# Patient Record
Sex: Male | Born: 1980 | Race: Black or African American | Hispanic: No | Marital: Single | Smoking: Current every day smoker
Health system: Southern US, Community
[De-identification: ages and names within clinical notes are randomized; demographics above are authoritative.]

## PROBLEM LIST (undated history)

## (undated) HISTORY — PX: HERNIA REPAIR: SHX51

## (undated) HISTORY — PX: KNEE ARTHROSCOPY: SUR90

## (undated) HISTORY — PX: HAND ARTHROPLASTY: SHX968

---

## 2005-03-31 ENCOUNTER — Ambulatory Visit: Payer: Self-pay | Admitting: Internal Medicine

## 2005-03-31 ENCOUNTER — Inpatient Hospital Stay (HOSPITAL_COMMUNITY): Admission: EM | Admit: 2005-03-31 | Discharge: 2005-04-06 | Payer: Self-pay | Admitting: Emergency Medicine

## 2005-04-03 ENCOUNTER — Ambulatory Visit: Payer: Self-pay | Admitting: Internal Medicine

## 2005-04-19 ENCOUNTER — Ambulatory Visit: Payer: Self-pay | Admitting: Family Medicine

## 2005-04-24 ENCOUNTER — Ambulatory Visit: Payer: Self-pay | Admitting: Family Medicine

## 2007-04-11 IMAGING — CT CT CHEST W/ CM
2 of 6 series · 15 of 36 positions shown, 18 images · IV contrast (omnipaque)
Comparison: none

CLINICAL DATA: Hemoptysis, cough.    Right hilar mass and possible right lung cavitation seen on recent chest radiograph.
CHEST CT WITH CONTRAST:
TECHNIQUE: Multidetector CT imaging of the chest was performed following the standard protocol during bolus administration of intravenous contrast.
Contrast:  80 cc Omnipaque 300.

[Series 5: thin chest 2.0 b40f st · axial · 0.71mm/px · z∈[+1083,+1421]mm · 12 of 533 slices shown, 15 images]
[im 25/533  mediastinal]
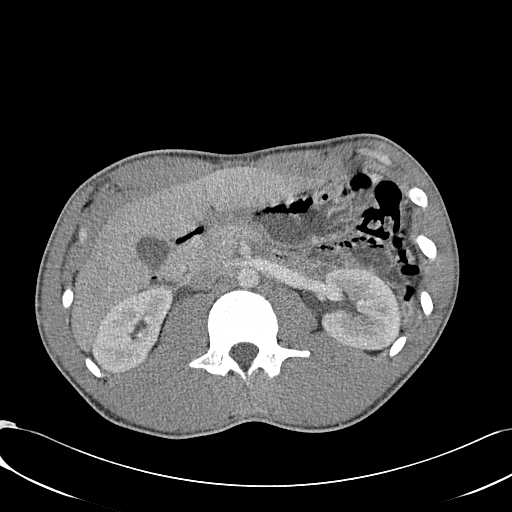
[im 25/533  lung]
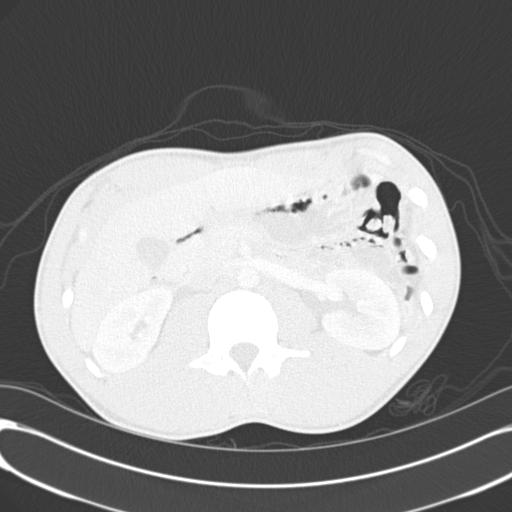
[im 73/533  lung]
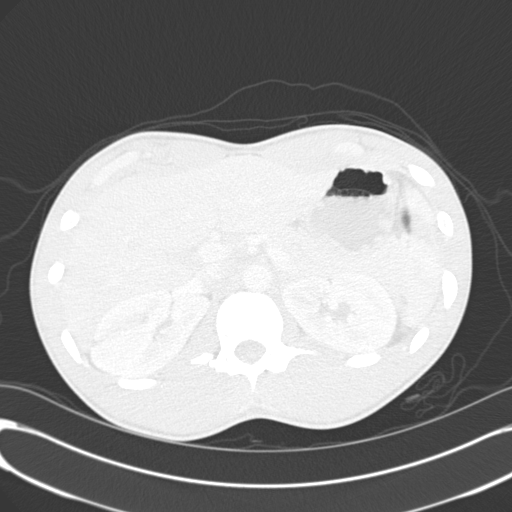
[im 121/533  lung]
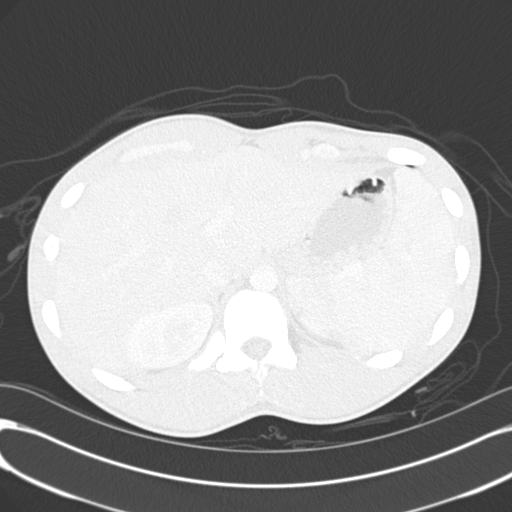
[im 170/533  lung]
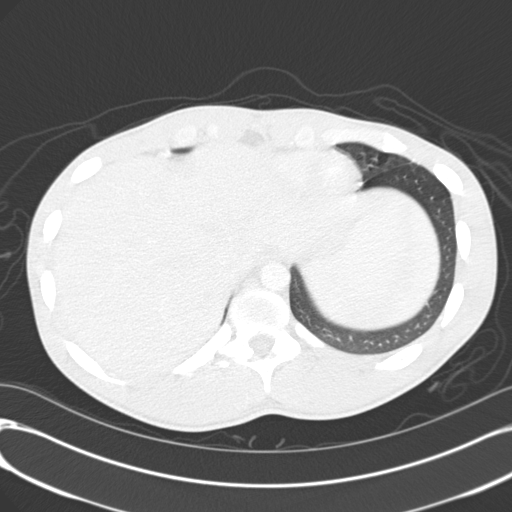
[im 194/533  mediastinal]
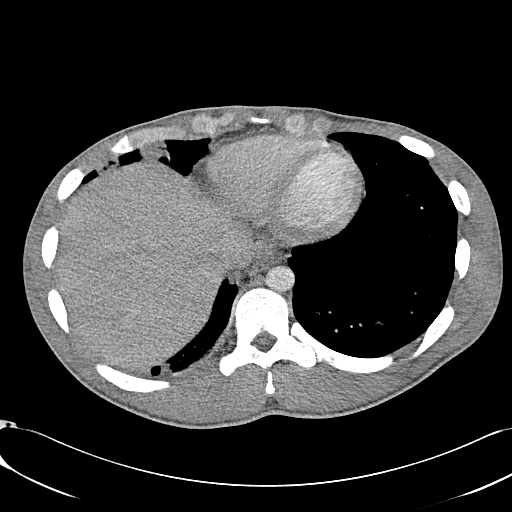
[im 194/533  lung]
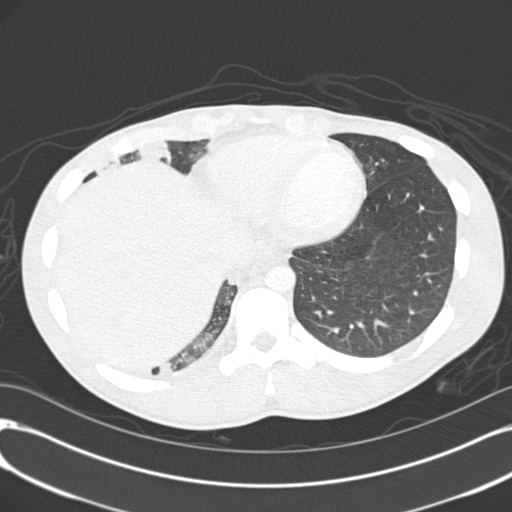
[im 242/533  lung]
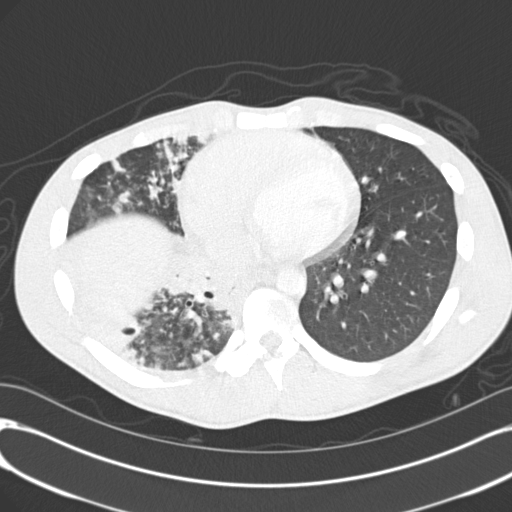
[im 291/533  lung]
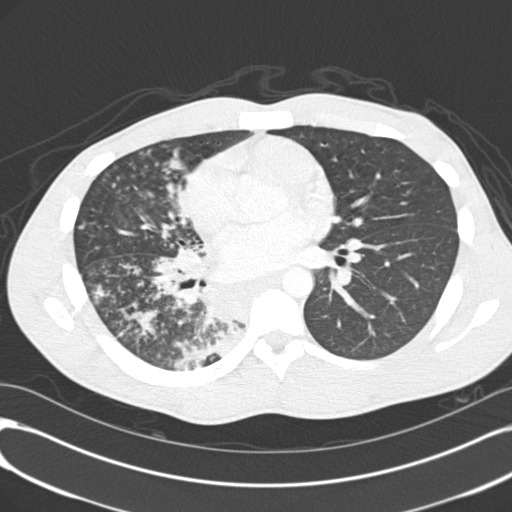
[im 339/533  lung]
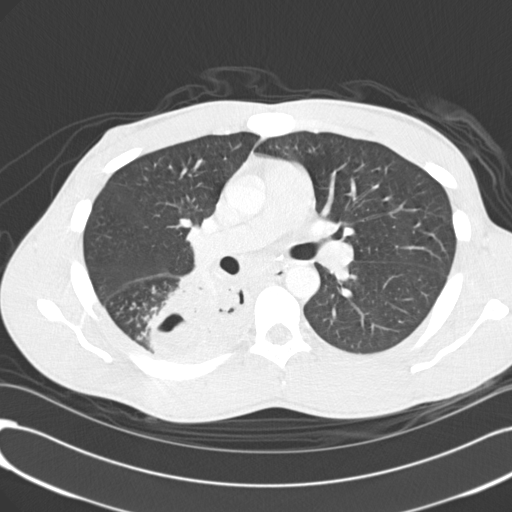
[im 363/533  mediastinal]
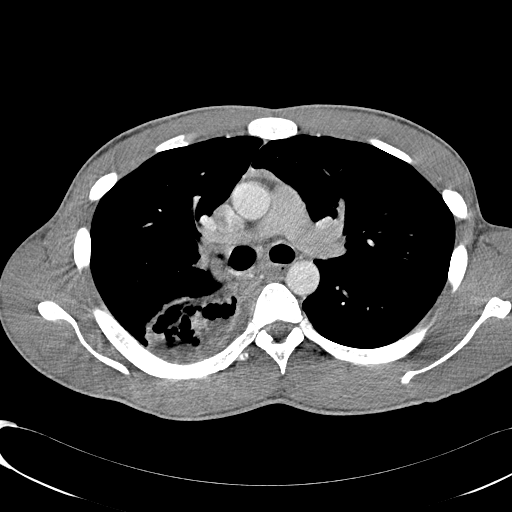
[im 363/533  lung]
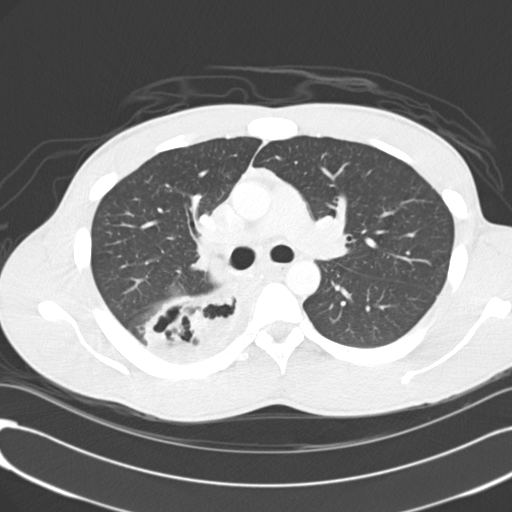
[im 412/533  lung]
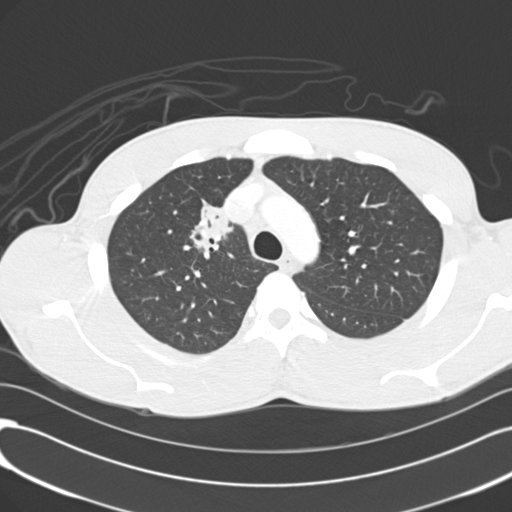
[im 460/533  lung]
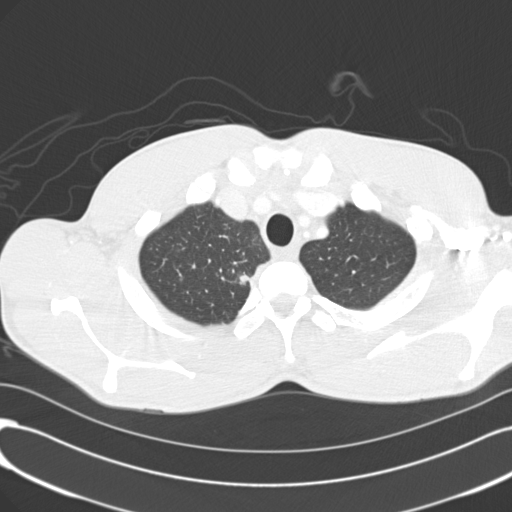
[im 508/533  lung]
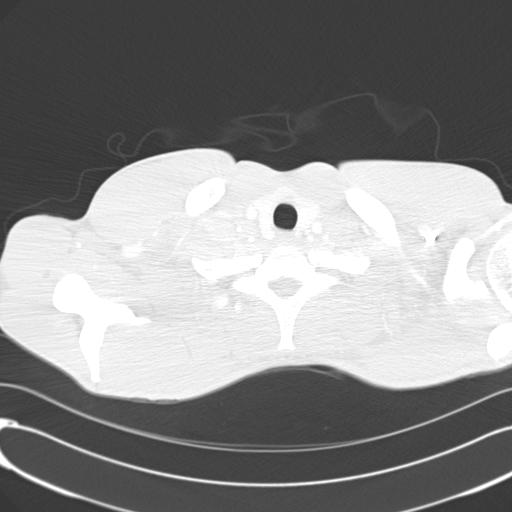

[Series 602: <mpr range> · coronal · 0.73mm/px · 3 of 50 slices shown]
[im 10/50  lung]
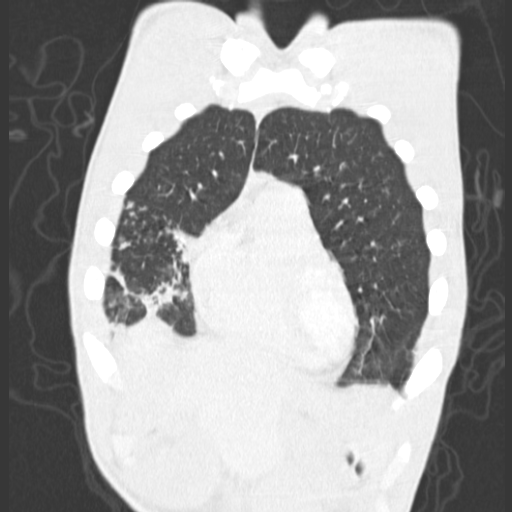
[im 20/50  lung]
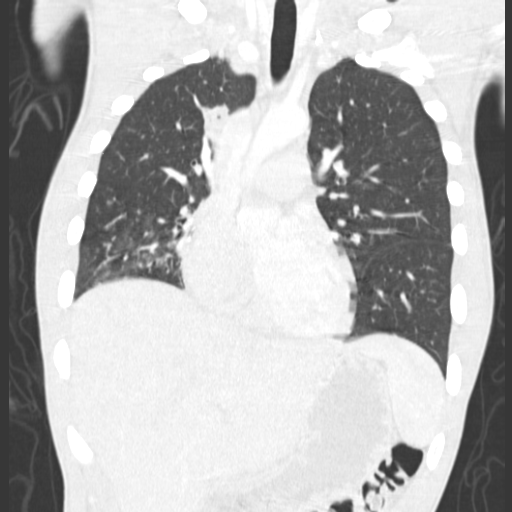
[im 30/50  lung]
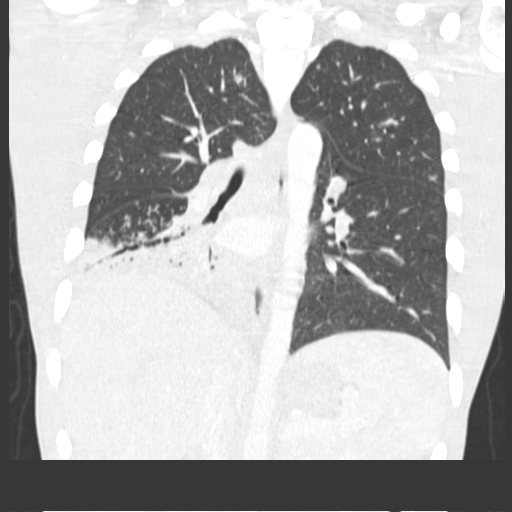

[15 of 36 positions shown; findings below may reference images not displayed]

FINDINGS: Confluent infiltrate is seen involving the superior and anterior portions of the right lower lobe and nodular interstitial infiltrates are seen involving the right middle lobe and to a lesser degree, the right upper lobe.  Mild central bronchiectasis is seen in the right lower lobe and more prominent lucencies in the superior right lower lobe also having tubular configuration suspicious for more severe bronchiectasis rather than cavitation.  
Mild right hilar adenopathy is seen, however, there is no evidence of endobronchial obstruction.  There is no evidence of significant mediastinal or left hilar adenopathy.  Left lung is clear. There is no evidence of pleural effusion.
IMPRESSION: 1.  Right lower lobe confluent infiltrate with extensive nodular interstitial infiltrates involving the remainder of the right lung.  This is most likely due to infection.  
2.  Mild right hilar adenopathy, which is likely reactive.  Central bronchiectasis also noted in the right upper lobe with more severe bronchiectasis in the superior segment.

## 2015-04-30 ENCOUNTER — Ambulatory Visit (INDEPENDENT_AMBULATORY_CARE_PROVIDER_SITE_OTHER): Payer: BLUE CROSS/BLUE SHIELD | Admitting: Family Medicine

## 2015-04-30 ENCOUNTER — Encounter: Payer: Self-pay | Admitting: Family Medicine

## 2015-04-30 VITALS — BP 123/70 | HR 84 | Temp 97.4°F | Ht 75.0 in | Wt 200.8 lb

## 2015-04-30 DIAGNOSIS — Z720 Tobacco use: Secondary | ICD-10-CM | POA: Diagnosis not present

## 2015-04-30 DIAGNOSIS — Z716 Tobacco abuse counseling: Secondary | ICD-10-CM

## 2015-04-30 DIAGNOSIS — J029 Acute pharyngitis, unspecified: Secondary | ICD-10-CM

## 2015-04-30 DIAGNOSIS — J028 Acute pharyngitis due to other specified organisms: Principal | ICD-10-CM

## 2015-04-30 DIAGNOSIS — Z202 Contact with and (suspected) exposure to infections with a predominantly sexual mode of transmission: Secondary | ICD-10-CM

## 2015-04-30 DIAGNOSIS — B9789 Other viral agents as the cause of diseases classified elsewhere: Principal | ICD-10-CM

## 2015-04-30 MED ORDER — NICOTINE POLACRILEX 2 MG MT GUM: 2.0000 mg | CHEWING_GUM | OROMUCOSAL | Status: AC | PRN

## 2015-04-30 MED ORDER — CETIRIZINE HCL 10 MG PO TABS: 10.0000 mg | ORAL_TABLET | Freq: Every day | ORAL | Status: AC

## 2015-04-30 MED ORDER — FLUTICASONE PROPIONATE 50 MCG/ACT NA SUSP: 1.0000 | Freq: Two times a day (BID) | NASAL | Status: AC | PRN

## 2015-04-30 NOTE — Progress Notes (Signed)
BP 123/70 mmHg  Pulse 84  Temp(Src) 97.4 F (36.3 C) (Oral)  Ht 6\' 3"  (1.905 m)  Wt 200 lb 12.8 oz (91.082 kg)  BMI 25.10 kg/m2   Subjective:    Patient ID: Alfred Rogers, male    DOB: 31-May-1980, 34 y.o.   MRN: [id number]  HPI: Alfred Rogers is a 34 y.o. male presenting on 04/30/2015 for Sinus drainage and Tonsils swollen   HPI Sinus drainage and sore throat Patient has a one-day history of his congestion and drainage and sinus pressure and sore throat and postnasal drainage. He denies any fevers or chills. He has not tried any over-the-counter medication except Tylenol. He denies any sick contacts that he knows of. He does have a mild cough but is not coughing anything up.  Possible STD exposure He has had a couple of different partners over the past year and does not always use condoms with him and they are not exclusive to him and he is not exclusive to them. He does not have any symptoms of STDs but would like to be checked for them.  Smoker Patient currently smokes about a quarter pack per day and would like to quit before he started smoking more. He knows about the many different options and would like to try Nicorette gum. He denies any cough or shortness of breath that he has chronically because of his smoking. He denies any wheezing issues.  Relevant past medical, surgical, family and social history reviewed and updated as indicated. Interim medical history since our last visit reviewed. Allergies and medications reviewed and updated.  Review of Systems  Constitutional: Negative for fever and chills.  HENT: Positive for congestion, postnasal drip, rhinorrhea, sinus pressure, sneezing and sore throat. Negative for ear discharge, ear pain and voice change.   Eyes: Negative for pain, discharge, redness and visual disturbance.  Respiratory: Positive for cough. Negative for chest tightness, shortness of breath and wheezing.   Cardiovascular: Negative for chest pain and  leg swelling.  Gastrointestinal: Negative for abdominal pain, diarrhea and constipation.  Genitourinary: Negative for dysuria, flank pain, discharge, penile swelling, scrotal swelling, difficulty urinating, genital sores, penile pain and testicular pain.  Musculoskeletal: Negative for back pain and gait problem.  Skin: Negative for rash.  Neurological: Negative for dizziness, syncope, light-headedness and headaches.  All other systems reviewed and are negative.   Per HPI unless specifically indicated above  Social History   Social History  . Marital Status: Single    Spouse Name: N/A  . Number of Children: N/A  . Years of Education: N/A   Occupational History  . Not on file.   Social History Main Topics  . Smoking status: Current Every Day Smoker -- 1.00 packs/day    Types: Cigarettes  . Smokeless tobacco: Never Used  . Alcohol Use: 6.0 oz/week    0 Standard drinks or equivalent, 10 Cans of beer per week     Comment: 1-2 per day  . Drug Use: No  . Sexual Activity: Yes    Birth Control/ Protection: Condom     Comment: 3 partners currently, uses condoms most of the time   Other Topics Concern  . Not on file   Social History Narrative  . No narrative on file    Past Surgical History  Procedure Laterality Date  . Knee arthroscopy Left   . Hand arthroplasty Left   . Hernia repair      History reviewed. No pertinent family history.    Medication  List       This list is accurate as of: 04/30/15  2:52 PM.  Always use your most recent med list.               cetirizine 10 MG tablet  Commonly known as:  ZYRTEC  Take 1 tablet (10 mg total) by mouth daily.     fluticasone 50 MCG/ACT nasal spray  Commonly known as:  FLONASE  Place 1 spray into both nostrils 2 (two) times daily as needed for allergies or rhinitis.     nicotine polacrilex 2 MG gum  Commonly known as:  NICORETTE  Take 1 each (2 mg total) by mouth as needed for smoking cessation.             Objective:    BP 123/70 mmHg  Pulse 84  Temp(Src) 97.4 F (36.3 C) (Oral)  Ht  (1.905 m)  Wt 200 lb 12.8 oz (91.082 kg)  BMI 25.10 kg/m2  Wt Readings from Last 3 Encounters:  04/30/15 200 lb 12.8 oz (91.082 kg)    Physical Exam  Constitutional: He is oriented to person, place, and time. He appears well-developed and well-nourished. No distress.  HENT:  Right Ear: Tympanic membrane, external ear and ear canal normal.  Left Ear: Tympanic membrane, external ear and ear canal normal.  Nose: Mucosal edema and rhinorrhea present. No sinus tenderness. No epistaxis. Right sinus exhibits maxillary sinus tenderness. Right sinus exhibits no frontal sinus tenderness. Left sinus exhibits maxillary sinus tenderness. Left sinus exhibits no frontal sinus tenderness.  Mouth/Throat: Uvula is midline and mucous membranes are normal. Posterior oropharyngeal edema and posterior oropharyngeal erythema present. No oropharyngeal exudate or tonsillar abscesses.  Eyes: Conjunctivae and EOM are normal. Pupils are equal, round, and reactive to light. Right eye exhibits no discharge. No scleral icterus.  Neck: Neck supple. No thyromegaly present.  Cardiovascular: Normal rate, regular rhythm, normal heart sounds and intact distal pulses.   No murmur heard. Pulmonary/Chest: Effort normal and breath sounds normal. No respiratory distress. He has no wheezes.  Abdominal: Soft. He exhibits no distension. There is no tenderness. There is no rebound.  Genitourinary: Testes normal and penis normal.  Musculoskeletal: Normal range of motion. He exhibits no edema.  Lymphadenopathy:    He has no cervical adenopathy.  Neurological: He is alert and oriented to person, place, and time. Coordination normal.  Skin: Skin is warm and dry. No rash noted. He is not diaphoretic.  Psychiatric: He has a normal mood and affect. His behavior is normal.  Nursing note and vitals reviewed.   No results found for this or any previous  visit.    Assessment & Plan:   Problem List Items Addressed This Visit    None    Visit Diagnoses    Acute viral pharyngitis    -  Primary    Relevant Medications    fluticasone (FLONASE) 50 MCG/ACT nasal spray    cetirizine (ZYRTEC) 10 MG tablet    Possible exposure to STD        Relevant Orders    HIV antibody (Completed)    GC/Chlamydia Probe Amp (Completed)    RPR (Completed)    Hepatitis B surface antibody (Completed)    Hepatitis C antibody (Completed)    Encounter for smoking cessation counseling        Relevant Medications    nicotine polacrilex (NICORETTE) 2 MG gum        Follow up plan: Return if symptoms worsen or  fail to improve.  Arville Care, MD [facility] 04/30/2015, 2:52 PM

## 2015-05-01 LAB — HEPATITIS B SURFACE ANTIBODY,QUALITATIVE: Hep B Surface Ab, Qual: REACTIVE

## 2015-05-01 LAB — RPR: RPR Ser Ql: NONREACTIVE

## 2015-05-01 LAB — HEPATITIS C ANTIBODY: Hep C Virus Ab: 0.1 s/co ratio (ref 0.0–0.9)

## 2015-05-01 LAB — HIV ANTIBODY (ROUTINE TESTING W REFLEX): HIV Screen 4th Generation wRfx: NONREACTIVE

## 2015-05-03 LAB — GC/CHLAMYDIA PROBE AMP
Chlamydia trachomatis, NAA: NEGATIVE
NEISSERIA GONORRHOEAE BY PCR: NEGATIVE

## 2017-01-15 ENCOUNTER — Emergency Department: Payer: Self-pay

## 2017-01-15 ENCOUNTER — Emergency Department
Admission: EM | Admit: 2017-01-15 | Discharge: 2017-01-15 | Disposition: A | Discharge status: Home / Self Care | Payer: Self-pay | Attending: Family Medicine | Admitting: Family Medicine

## 2017-01-15 DIAGNOSIS — R200 Anesthesia of skin: Secondary | ICD-10-CM | POA: Insufficient documentation

## 2017-01-15 DIAGNOSIS — R0602 Shortness of breath: Secondary | ICD-10-CM | POA: Insufficient documentation

## 2017-01-15 DIAGNOSIS — R002 Palpitations: Secondary | ICD-10-CM | POA: Insufficient documentation

## 2017-01-15 LAB — ECG 12-LEAD
P Wave Axis: 71 deg
P-R Interval: 175 ms
Patient Age: 35 years
Q-T Interval(Corrected): 412 ms
Q-T Interval: 361 ms
QRS Axis: 79 deg
QRS Duration: 97 ms
T Axis: 64 years
Ventricular Rate: 78 //min

## 2017-01-15 LAB — CBC AND DIFFERENTIAL
Bands: 2 % (ref 0–10)
Basophils Absolute: 0 10*3/uL (ref 0.0–0.3)
Eosinophils %: 4 % (ref 0.0–7.0)
Eosinophils Absolute: 0.3 10*3/uL (ref 0.0–0.8)
Hematocrit: 44.5 % (ref 39.0–52.5)
Hemoglobin: 15 gm/dL (ref 13.0–17.5)
Lymphocytes Absolute: 3.7 10*3/uL (ref 0.6–5.1)
Lymphocytes: 44 % (ref 15.0–46.0)
MCH: 30 pg (ref 28–35)
MCHC: 34 gm/dL (ref 31–36)
MCV: 88 fL (ref 80–100)
MPV: 8.7 fL (ref 6.0–10.0)
Monocytes Absolute: 0.2 10*3/uL (ref 0.1–1.7)
Monocytes: 2 % — ABNORMAL LOW (ref 3.0–15.0)
Neutrophils %: 48 % (ref 42.0–78.0)
Neutrophils Absolute: 4.2 10*3/uL (ref 1.7–8.6)
PLT CT: 188 10*3/uL (ref 130–440)
RBC: 5.03 10*6/uL (ref 4.00–5.70)
RDW: 12.7 % (ref 10.5–14.5)
WBC: 8.4 10*3/uL (ref 4.00–11.00)

## 2017-01-15 LAB — COMPREHENSIVE METABOLIC PANEL
ALT: 26 U/L (ref 0–55)
AST (SGOT): 25 U/L (ref 10–42)
Albumin/Globulin Ratio: 1.25 Ratio (ref 0.70–1.50)
Albumin: 4.3 gm/dL (ref 3.5–5.0)
Alkaline Phosphatase: 112 U/L (ref 40–145)
Anion Gap: 14.8 mMol/L (ref 7.0–18.0)
BUN / Creatinine Ratio: 10.8 Ratio (ref 10.0–30.0)
BUN: 11 mg/dL (ref 7–22)
Bilirubin, Total: 0.8 mg/dL (ref 0.1–1.2)
CO2: 26.1 mMol/L (ref 20.0–30.0)
Calcium: 10.2 mg/dL (ref 8.5–10.5)
Chloride: 101 mMol/L (ref 98–110)
Creatinine: 1.02 mg/dL (ref 0.80–1.30)
EGFR: 111 mL/min/{1.73_m2} (ref 60–150)
Globulin: 3.4 gm/dL (ref 2.0–4.0)
Glucose: 91 mg/dL (ref 71–99)
Osmolality Calc: 276 mOsm/kg (ref 275–300)
Potassium: 3.3 mMol/L — ABNORMAL LOW (ref 3.5–5.3)
Protein, Total: 7.7 gm/dL (ref 6.0–8.3)
Sodium: 139 mMol/L (ref 136–147)

## 2017-01-15 LAB — D-DIMER, QUANTITATIVE: D-Dimer: <0.19 mg/L FEU — ABNORMAL LOW (ref 0.19–0.52)

## 2017-01-15 LAB — TROPONIN I: Troponin I: <0.01 ng/mL (ref 0.00–0.02)

## 2017-01-15 MED ORDER — ASPIRIN 81 MG PO CHEW
324.0000 mg | CHEWABLE_TABLET | Freq: Once | ORAL | Status: AC
Start: 2017-01-15 — End: 2017-01-15
  Administered 2017-01-15: 05:00:00 324 mg via ORAL

## 2017-01-15 MED ORDER — SODIUM CHLORIDE 0.9 % IV SOLN
INTRAVENOUS | Status: DC
Start: 2017-01-15 — End: 2017-01-15

## 2017-01-15 MED ORDER — ASPIRIN 81 MG PO CHEW
CHEWABLE_TABLET | ORAL | Status: AC
Start: 2017-01-15 — End: ?
  Filled 2017-01-15: qty 4

## 2017-01-15 NOTE — ED Triage Notes (Signed)
See quick triage note.

## 2017-01-15 NOTE — ED Provider Notes (Signed)
EMERGENCY DEPARTMENT HISTORY AND PHYSICAL EXAM    Date: 01/15/17  Patient Name: Drew Kirby  Attending Physician: Alger Simons, MD  Patient DOB:  August 18, 1980  MRN:  [id number]  Room:  E11/EDA11-A      History of Presenting Illness     Chief Complaint: Left arm numbness and SOB     HPI/ROS is limited by: none  HPI/ROS given by: patient       Context: Drew Kirby is a 36 y.o. male who presents with having developed Left arm numbness and a feeling of shortness of breath while he and his son were driving. This began 15-20 minutes ago after he drank a Red Bull. He also felt like his heart was racing. He drank a Red Bull earlier as well.   Location: Left arm  Severity: unknown  Duration: 15-20 minutes   Quality: numb feeling   Associated Signs/ Symptoms: dyspnea and Left arm numbness  Exacerbation/Mitigating factors: unknown       PMD: Pcp, Noneorunknown, MD    Past Medical History     History reviewed. No pertinent past medical history.    Past Surgical History     Past Surgical History:   Procedure Laterality Date   . HAND SURGERY     . HERNIA REPAIR     . KNEE SURGERY         Family History     History reviewed. No pertinent family history.    Social History     Social History     Social History   . Marital status: Significant Other     Spouse name: N/A   . Number of children: N/A   . Years of education: N/A     Social History Main Topics   . Smoking status: Current Every Day Smoker     Packs/day: 0.50   . Smokeless tobacco: Never Used   . Alcohol use Yes      Comment: socially   . Drug use: No   . Sexual activity: Not on file     Other Topics Concern   . Not on file     Social History Narrative   . No narrative on file       Allergies     No Known Allergies    Home Medications     Prior to Admission medications    Medication Sig Start Date End Date Taking? Authorizing Provider   cyclobenzaprine (FLEXERIL) 10 MG tablet Take 10 mg by mouth 3 (three) times daily as needed for Muscle spasms.   Yes [provider]   oxyCODONE-acetaminophen (PERCOCET) 10-325 MG per tablet Take 1 tablet by mouth every 4 (four) hours as needed for Pain.   Yes [provider]       ED Medications Administered     ED Medication Orders     Start Ordered     Status Ordering Provider    01/15/17 0447 01/15/17 0446  0.9%  NaCl infusion  Continuous     Route: Intravenous     Last MAR action:  [address] Minette Brine II    01/15/17 0447 01/15/17 0446  aspirin chewable tablet 324 mg  Once in ED     Route: Oral  Ordered Dose: 324 mg     Last MAR action:  Given Abbygail [place] J II            Review of Systems     Review of Systems   Constitutional: Negative  for chills and fever.   HENT: Negative for ear pain and sore throat.    Eyes: Negative for discharge and redness.   Respiratory: Positive for shortness of breath. Negative for cough and wheezing.    Cardiovascular: Positive for palpitations. Negative for chest pain and leg swelling.   Gastrointestinal: Negative for abdominal pain, diarrhea, nausea and vomiting.   Genitourinary: Negative for dysuria and urgency.   Musculoskeletal: Negative for neck pain.   Skin: Negative for rash.   Neurological: Negative for focal weakness, seizures and loss of consciousness.   Endo/Heme/Allergies: Does not bruise/bleed easily.   All other systems reviewed and are negative.        Physical Exam     Physical Exam   Constitutional: He is oriented to person, place, and time. He appears well-developed and well-nourished.   HENT:   Head: Normocephalic and atraumatic.   Mouth/Throat: Oropharynx is clear and moist.   Eyes: Pupils are equal, round, and reactive to light. EOM are normal.   Neck: Normal range of motion. Neck supple.   Cardiovascular: Normal rate, regular rhythm, normal heart sounds and intact distal pulses.    Pulmonary/Chest: Effort normal and breath sounds normal.   Abdominal: Soft. Bowel sounds are normal.   Musculoskeletal: Normal range of motion.   Neurological: He is alert and oriented  to person, place, and time.   Skin: Skin is warm and dry.   Nursing note and vitals reviewed.      Procedures     N/A    Diagnostic Study Results     EKG: 04:40 NSR rate 78, ST elevation V3, V4, consider pericarditis, Abnormal ECG-interpreted by WJB    Monitor: N/A    Laboratory results reviewed by ED provider:    Results     Procedure Component Value Units Date/Time    CBC [[id number]]  (Abnormal) Collected:  01/15/17 0450    Specimen:  Blood from Blood Updated:  01/15/17 0522     WBC 8.4 K/cmm      RBC 5.03 M/cmm      Hemoglobin 15.0 gm/dL      Hematocrit 88.4 %      MCV 88 fL      MCH 30 pg      MCHC 34 gm/dL      RDW 16.6 %      PLT CT 188 K/cmm      MPV 8.7 fL      NEUTROPHIL % 48.0 %      Lymphocytes 44.0 %      Monocytes 2.0 (L) %      Eosinophils % 4.0 %      Bands 2 %      Neutrophils Absolute 4.2 K/cmm      Lymphocytes Absolute 3.7 K/cmm      Monocytes Absolute 0.2 K/cmm      Eosinophils Absolute 0.3 K/cmm      BASO Absolute 0.0 K/cmm      RBC Morphology Morphology Consistent with Hemogram    Narrative:       Manual differential performed    Troponin I (Stat) [[id number]] Collected:  01/15/17 0450    Specimen:  Plasma Updated:  01/15/17 0522     Troponin I <0.01 ng/mL     CMP [[id number]]  (Abnormal) Collected:  01/15/17 0450    Specimen:  Plasma Updated:  01/15/17 0516     Sodium 139 mMol/L      Potassium 3.3 (L) mMol/L      Chloride  101 mMol/L      CO2 26.1 mMol/L      Calcium 10.2 mg/dL      Glucose 91 mg/dL      Creatinine 1.61 mg/dL      BUN 11 mg/dL      Protein, Total 7.7 gm/dL      Albumin 4.3 gm/dL      Alkaline Phosphatase 112 U/L      ALT 26 U/L      AST (SGOT) 25 U/L      Bilirubin, Total 0.8 mg/dL      Albumin/Globulin Ratio 1.25 Ratio      Anion Gap 14.8 mMol/L      BUN/Creatinine Ratio 10.8 Ratio      EGFR 111 mL/min/1.33m2      Osmolality Calc 276 mOsm/kg      Globulin 3.4 gm/dL     D-dimer, quantitative [[id number]]  (Abnormal) Collected:  01/15/17 0450    Specimen:  Blood Updated:  01/15/17  0513     D-Dimer <0.19 (L) mg/L FEU           Radiologic study results reviewed by ED provider:    Radiology Results (24 Hour)     Procedure Component Value Units Date/Time    XR Chest 2 Views [[id number]] Collected:  01/15/17 0547    Order Status:  Completed Updated:  01/15/17 0548    Narrative:       Clinical History:  Reason For Exam: Shortness of Breath  Chest pains and shortness of breath onset tonight    Examination:  Frontal and lateral views of the chest.    Comparison:  None available.    Findings:  Mediastinal contours and heart normal size and configuration.     Pulmonary vascularity unremarkable.     Lungs clear and normally aerated.     No pleural effusion or thickening.     Bones normally mineralized.    Soft-tissues unremarkable.      Impression:       Normal chest.    ReadingStation:[email or web address]      .    Rendering Provider: Minette Brine II, MD      VS     Patient Vitals for the past 24 hrs:   Temp Temp src Pulse Resp SpO2 Height Weight   01/15/17 0426 97.4 F (36.3 C) Oral 83 14 100 % 1.905 m 86.8 kg         Clinical Course in Emergency Department     Consults: N/A    Reevaluation: 05:59 Pt feels fine now. He will follow up with his PMD.     MDM: MI, PE, Caffeine Reaction,     Diagnosis and Disposition     Clinical Impression  1. Numbness        Disposition  ED Disposition     ED Disposition Condition Date/Time Comment    Discharge  Mon Jan 15, 2017  5:54 AM Drew Kirby discharge to home/self care.    Condition at disposition: Stable          Vital signs were reviewed at the time of disposition.  Patient Vitals for the past 24 hrs:   Temp Temp src Pulse Resp SpO2 Height Weight   01/15/17 0426 97.4 F (36.3 C) Oral 83 14 100 % 1.905 m 86.8 kg          Prescriptions  New Prescriptions    No medications on file         Follow-up Information  Please follow up.    Why:  Follow up with your PMD as discussed                      SIGNED BY: Minette Brine II, MD        This chart was generated by  an EMR and may contain errors, including typographical, or omissions not intended by the user.               Alger Simons, MD  01/15/17 [id number]

## 2017-01-15 NOTE — Discharge Instructions (Signed)
Paraesthesias  Paraesthesia is a burning or prickling sensation that is sometimes felt in the hands, arms, legs or feet. It can also occur in other parts of the body. It can also feel like tingling or numbness, skin crawling, or itching. The feeling is not comfortable, but it is not painful. (The "pins and needles" feeling that happens when a foot or hand "falls asleep" is a temporary paraesthesia.)  Paraesthesias that last or come and go may be caused by medical issues that need to be treated. These include stroke, a bulging disk pressing on a nerve, a trapped nerve, vitamin deficiencies, or even certain medicines.  Tests are often done. These tests may include blood tests, X-ray, CT (computerized tomography) scan, or a muscle test (electromyography). Depending on the cause, treatment may include physical therapy.  Home care   Tell the healthcare provider about all medicines you take. This includes prescription and over-the-counter medicines, vitamins, and herbs. Ask if any of the medicines may be causing your problems. Do not make any changes to prescription medicines without talking to your healthcare provider first.   You may be prescribed medicines to help relieve the tingling feeling or for pain. Take all medicines as directed.   A numb hand or foot may be more prone to injury. To help protect it:   Always use oven mitts.   Test water with an unaffected hand or foot.   Use caution when trimming nails. File sharp areas.   Wear shoes that fit well to avoid pressure points, blisters, and ulcers.   Inspect your hands and feet carefully (including the soles of your feet and between your toes) at least once a week. If you see red areas, sores, or other problems, tell your healthcare provider.  Follow-up care  Follow up with your doctor or as advised by our staff. You may need further testing or evaluation.  When to seek medical advice  Call your healthcare provider right away if any of the following  occur:   Numbness or weakness of the face, one arm, or one leg   Slurred speech, confusion, trouble speaking, walking, or seeing   Severe headache, fainting spell, dizziness, or seizure   Chest, arm, neck, or upper back pain   Loss of bladder or bowel control   Open wound with redness, swelling, or pus  Date Last Reviewed: 02/20/2014   2000-2016 [employer] [address], [place] [address]. All rights reserved. This information is not intended as a substitute for professional medical care. Always follow your healthcare professional's instructions.

## 2019-11-05 ENCOUNTER — Emergency Department (HOSPITAL_COMMUNITY)
Admission: EM | Admit: 2019-11-05 | Discharge: 2019-11-05 | Disposition: A | Discharge status: Home / Self Care | Payer: Self-pay | Attending: Emergency Medicine | Admitting: Emergency Medicine

## 2019-11-05 ENCOUNTER — Encounter (HOSPITAL_COMMUNITY): Payer: Self-pay

## 2019-11-05 ENCOUNTER — Telehealth (HOSPITAL_COMMUNITY): Payer: Self-pay | Admitting: Emergency Medicine

## 2019-11-05 ENCOUNTER — Other Ambulatory Visit: Payer: Self-pay

## 2019-11-05 ENCOUNTER — Emergency Department (HOSPITAL_COMMUNITY): Payer: Self-pay

## 2019-11-05 DIAGNOSIS — F17210 Nicotine dependence, cigarettes, uncomplicated: Secondary | ICD-10-CM | POA: Insufficient documentation

## 2019-11-05 DIAGNOSIS — R7401 Elevation of levels of liver transaminase levels: Secondary | ICD-10-CM | POA: Insufficient documentation

## 2019-11-05 DIAGNOSIS — R0602 Shortness of breath: Secondary | ICD-10-CM | POA: Insufficient documentation

## 2019-11-05 DIAGNOSIS — R000 Tachycardia, unspecified: Secondary | ICD-10-CM | POA: Insufficient documentation

## 2019-11-05 LAB — COMPREHENSIVE METABOLIC PANEL
ALT: 67 U/L — ABNORMAL HIGH (ref 0–44)
AST: 112 U/L — ABNORMAL HIGH (ref 15–41)
Albumin: 4.8 g/dL (ref 3.5–5.0)
Alkaline Phosphatase: 103 U/L (ref 38–126)
Anion gap: 14 (ref 5–15)
BUN: 15 mg/dL (ref 6–20)
CO2: 26 mmol/L (ref 22–32)
Calcium: 11 mg/dL — ABNORMAL HIGH (ref 8.9–10.3)
Chloride: 95 mmol/L — ABNORMAL LOW (ref 98–111)
Creatinine, Ser: 1.21 mg/dL (ref 0.61–1.24)
GFR calc Af Amer: >60 mL/min (ref >60)
GFR calc non Af Amer: >60 mL/min (ref >60)
Glucose, Bld: 116 mg/dL — ABNORMAL HIGH (ref 70–99)
Potassium: 3.3 mmol/L — ABNORMAL LOW (ref 3.5–5.1)
Sodium: 135 mmol/L (ref 135–145)
Total Bilirubin: 0.8 mg/dL (ref 0.3–1.2)
Total Protein: 8.7 g/dL — ABNORMAL HIGH (ref 6.5–8.1)

## 2019-11-05 LAB — CBC
HCT: 42.7 % (ref 39.0–52.0)
Hemoglobin: 14.8 g/dL (ref 13.0–17.0)
MCH: 30.6 pg (ref 26.0–34.0)
MCHC: 34.7 g/dL (ref 30.0–36.0)
MCV: 88.2 fL (ref 80.0–100.0)
Platelets: 249 10*3/uL (ref 150–400)
RBC: 4.84 MIL/uL (ref 4.22–5.81)
RDW: 14.7 % (ref 11.5–15.5)
WBC: 7.3 10*3/uL (ref 4.0–10.5)
nRBC: 0 % (ref 0.0–0.2)

## 2019-11-05 MED ORDER — ALBUTEROL SULFATE HFA 108 (90 BASE) MCG/ACT IN AERS: 2.0000 | INHALATION_SPRAY | RESPIRATORY_TRACT | 0 refills | Status: AC | PRN

## 2019-11-05 MED ORDER — ALBUTEROL SULFATE (5 MG/ML) 0.5% IN NEBU: 2.5000 mg | INHALATION_SOLUTION | Freq: Four times a day (QID) | RESPIRATORY_TRACT | 12 refills | Status: DC | PRN

## 2019-11-05 NOTE — Discharge Instructions (Signed)
Your x-ray is totally normal, you can use albuterol 2 puffs every 4 hours as needed for shortness of breath or coughing.  Please stop smoking, significantly reduce the amount of alcohol you drink in the amount of marijuana that you smoke.  Please be aware that the drinking has caused your liver to be mildly inflamed, this will get better with time if you stop drinking now.  You should follow up with a family doctor in the next 2 weeks for recheck.  I have given you a list of phone numbers for local doctors below.  Please call immediately to make the next available appointment.  Return to the ER for severe worsening symptoms.  [facility] Primary Care Doctor List    Kari Baars MD. Specialty: Pulmonary Disease Contact information: [address]  [address]  [place] [address]  [phone]   Syliva Overman, MD. Specialty: [facility] Contact information: [address]  [place] [address]  [phone]   Lilyan Punt, MD. Specialty: [facility] Contact information: [address]  [place] [address]  [phone]   Avon Gully, MD Specialty: Internal Medicine Contact information: [address] [place] [address]  [phone]   Catalina Pizza, MD. Specialty: Internal Medicine Contact information: [address]  [place] [address]  [phone]    [facility] (Dr. Selena Batten) Specialty: Family Medicine Contact information: [address]  [place] [address]  [phone]   John Giovanni, MD. Specialty: [facility] Contact information: [address]  [address]  [place] [address]  [phone]   Carylon Perches, MD. Specialty: Internal Medicine Contact information: [address]  [address]  [place] [address]  [phone]    [facility]  [address] [place], [place] [address] [phone]  Services [facility] offers a variety of basic health services.  Services include but are not limited to: Blood pressure checks  Heart rate checks  Blood sugar checks  Urine analysis  Rapid strep tests  Pregnancy tests.  Health education and referrals  People needing more complex services will be directed to a physician online. Using these virtual visits, doctors can evaluate and prescribe medicine and treatments. There will be no medication on-site, though [facility] will help patients fill their prescriptions at little to no cost.   For More information please go to: [email or web address]

## 2019-11-05 NOTE — ED Provider Notes (Signed)
[facility] EMERGENCY DEPARTMENT Provider Note   CSN: [id number] Arrival date & time: 11/05/19  1694     History Chief Complaint  Patient presents with  . Chest Pain    Corleone Lakeman is a 39 y.o. male.  HPI   39 y/o male - who has had SOB for a couple of months - all the time but worse with activity - can't get in a full breath - with left sided Chest tightness.  Some twinges of L sided pain that is sporadic.  No palpitations, no n/v/d/f/c and no abd pain, no cough, last night woke up feeling like something was choking him and coughed up phlegm.  Smokes daily for 12 years.  He does drink ETOH and MJ use, no leg  Swelling but has occasional left inner thigh pain - for a couple of months - intermittent.  History reviewed. No pertinent past medical history.  There are no problems to display for this patient.   Past Surgical History:  Procedure Laterality Date  . HAND ARTHROPLASTY Left   . HERNIA REPAIR    . KNEE ARTHROSCOPY Left        No family history on file.  Social History   Tobacco Use  . Smoking status: Current Every Day Smoker    Packs/day: 1.00    Types: Cigarettes  . Smokeless tobacco: Never Used  Substance Use Topics  . Alcohol use: Yes    Alcohol/week: 10.0 standard drinks    Types: 10 Cans of beer per week    Comment: 1-2 per day  . Drug use: No    Home Medications Prior to Admission medications   Medication Sig Start Date End Date Taking? Authorizing Provider  albuterol (PROVENTIL) (5 MG/ML) 0.5% nebulizer solution Take 0.5 mLs (2.5 mg total) by nebulization every 6 (six) hours as needed for wheezing or shortness of breath. 11/05/19   Eber Hong, MD  cetirizine (ZYRTEC) 10 MG tablet Take 1 tablet (10 mg total) by mouth daily. 04/30/15   Dettinger, Elige Radon, MD  fluticasone (FLONASE) 50 MCG/ACT nasal spray Place 1 spray into both nostrils 2 (two) times daily as needed for allergies or rhinitis. 04/30/15   Dettinger, Elige Radon, MD  nicotine polacrilex  (NICORETTE) 2 MG gum Take 1 each (2 mg total) by mouth as needed for smoking cessation. 04/30/15   Dettinger, Elige Radon, MD    Allergies    Patient has no known allergies.  Review of Systems   Review of Systems  All other systems reviewed and are negative.   Physical Exam Updated Vital Signs BP 134/85   Pulse 95   Temp 98.4 F (36.9 C) (Oral)   Resp 13   Ht 1.905 m (6\' 3" )   Wt 86.2 kg   SpO2 97%   BMI 23.75 kg/m   Physical Exam Vitals and nursing note reviewed.  Constitutional:      General: He is not in acute distress.    Appearance: He is well-developed.  HENT:     Head: Normocephalic and atraumatic.     Mouth/Throat:     Pharynx: No oropharyngeal exudate.  Eyes:     General: No scleral icterus.       Right eye: No discharge.        Left eye: No discharge.     Conjunctiva/sclera: Conjunctivae normal.     Pupils: Pupils are equal, round, and reactive to light.  Neck:     Thyroid: No thyromegaly.     Vascular:  No JVD.  Cardiovascular:     Rate and Rhythm: Regular rhythm. Tachycardia present.     Heart sounds: Normal heart sounds. No murmur. No friction rub. No gallop.      Comments: Mild tachycardia Pulmonary:     Effort: Pulmonary effort is normal. No respiratory distress.     Breath sounds: Normal breath sounds. No wheezing or rales.  Abdominal:     General: Bowel sounds are normal. There is no distension.     Palpations: Abdomen is soft. There is no mass.     Tenderness: There is no abdominal tenderness.  Musculoskeletal:        General: No tenderness. Normal range of motion.     Cervical back: Normal range of motion and neck supple.  Lymphadenopathy:     Cervical: No cervical adenopathy.  Skin:    General: Skin is warm and dry.     Findings: No erythema or rash.  Neurological:     Mental Status: He is alert.     Coordination: Coordination normal.  Psychiatric:        Behavior: Behavior normal.     ED Results / Procedures / Treatments    Labs (all labs ordered are listed, but only abnormal results are displayed) Labs Reviewed  COMPREHENSIVE METABOLIC PANEL - Abnormal; Notable for the following components:      Result Value   Potassium 3.3 (*)    Chloride 95 (*)    Glucose, Bld 116 (*)    Calcium 11.0 (*)    Total Protein 8.7 (*)    AST 112 (*)    ALT 67 (*)    All other components within normal limits  CBC    EKG EKG Interpretation  Date/Time:  Wednesday November 05 2019 08:27:15 EDT Ventricular Rate:  99 PR Interval:    QRS Duration: 85 QT Interval:  321 QTC Calculation: 412 R Axis:   84 Text Interpretation: Sinus rhythm Consider left ventricular hypertrophy No old tracing to compare Confirmed by Eber Hong ([id number]) on 11/05/2019 8:48:56 AM   Radiology DG Chest 2 View  Result Date: 11/05/2019 CLINICAL DATA:  Left chest heaviness and dyspnea on exertion since 10/31/2019. EXAM: CHEST - 2 VIEW COMPARISON:  03/31/2005. FINDINGS: The lungs are clear. Heart size is normal. There is no pneumothorax or pleural fluid. No acute or focal bony abnormality. IMPRESSION: Negative chest. Electronically Signed   By: Drusilla Kanner M.D.   On: 11/05/2019 09:54    Procedures Procedures (including critical care time)  Medications Ordered in ED Medications - No data to display  ED Course  I have reviewed the triage vital signs and the nursing notes.  Pertinent labs & imaging results that were available during my care of the patient were reviewed by me and considered in my medical decision making (see chart for details).    MDM Rules/Calculators/A&P                      This patient has normal heart sounds, there is no murmurs rubs or gallops, nothing to suggest pericarditis and is not positional.  His EKG shows mild sinus tachycardia with what looks like early repolarization diffusely.  There is no ischemic findings, no arrhythmia, mild left ventricular hypertrophy.  The patient's blood pressure seems to be well  controlled here, he is afebrile and only has a mild tachycardia which fluctuates in the room.  He does not appear anxious, he denies depression, he is not very active and  has had increasing fatigue over the last couple of weeks.  Patient was counseled on tobacco and alcohol use, marijuana use, will check chest x-ray, labs, EKG, cardiac monitoring, the patient is agreeable.  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  I have personally looked at the x-ray, there is no signs of infiltrates pneumothorax or other significant abnormalities.  His lab work is unremarkable except for a transaminitis suggesting this is likely related to his liver and alcohol use.  Minimal hypokalemia, hyperglycemia and a totally normal blood count.  This patient is stable for discharge, informed of his results and need for follow-up prior to discharge and given a full list of local family doctors for follow-up.  Final Clinical Impression(s) / ED Diagnoses Final diagnoses:  Shortness of breath  Transaminitis    Rx / DC Orders ED Discharge Orders         Ordered    albuterol (PROVENTIL) (5 MG/ML) 0.5% nebulizer solution  Every 6 hours PRN     11/05/19 1009           Noemi Chapel, MD 11/05/19 1011

## 2019-11-05 NOTE — Telephone Encounter (Signed)
Albuterol MDI claled in

## 2019-11-05 NOTE — ED Triage Notes (Signed)
Pt reports has been having chest pain and sob for "a while."  Pt says cp has been worse over the past 2 days.  Reports waking up feeling like he can't breathe.  Reports gets very sob with exertion.  Pt says pain is in left chest and doesn't radiate anywhere else.

## 2021-11-15 IMAGING — DX DG CHEST 2V
2 series · 2 of 2 positions shown · non-contrast
Comparison: 03/31/2005.

CLINICAL DATA: Left chest heaviness and dyspnea on exertion since
10/31/2019.

EXAM:
CHEST - 2 VIEW

[chest pa]
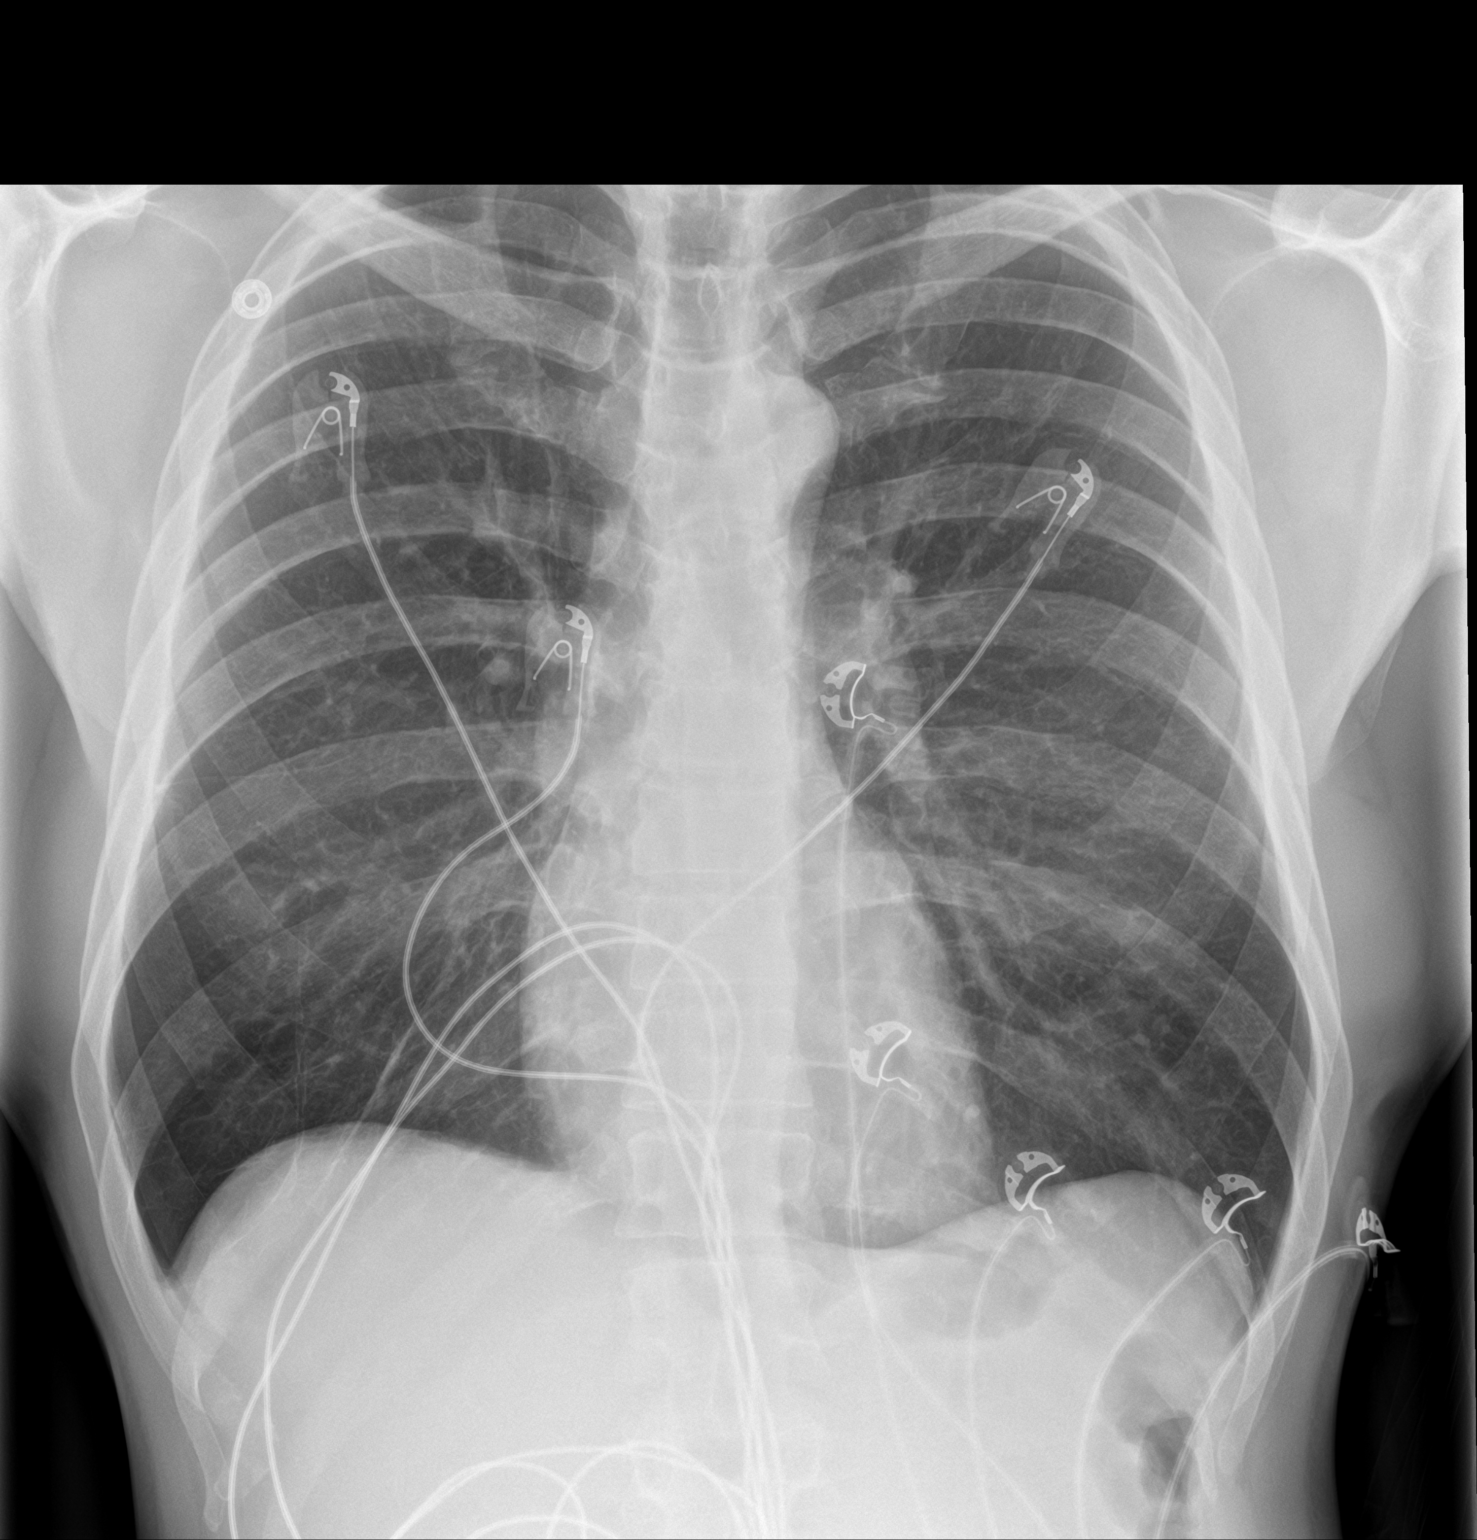

[chest lat]
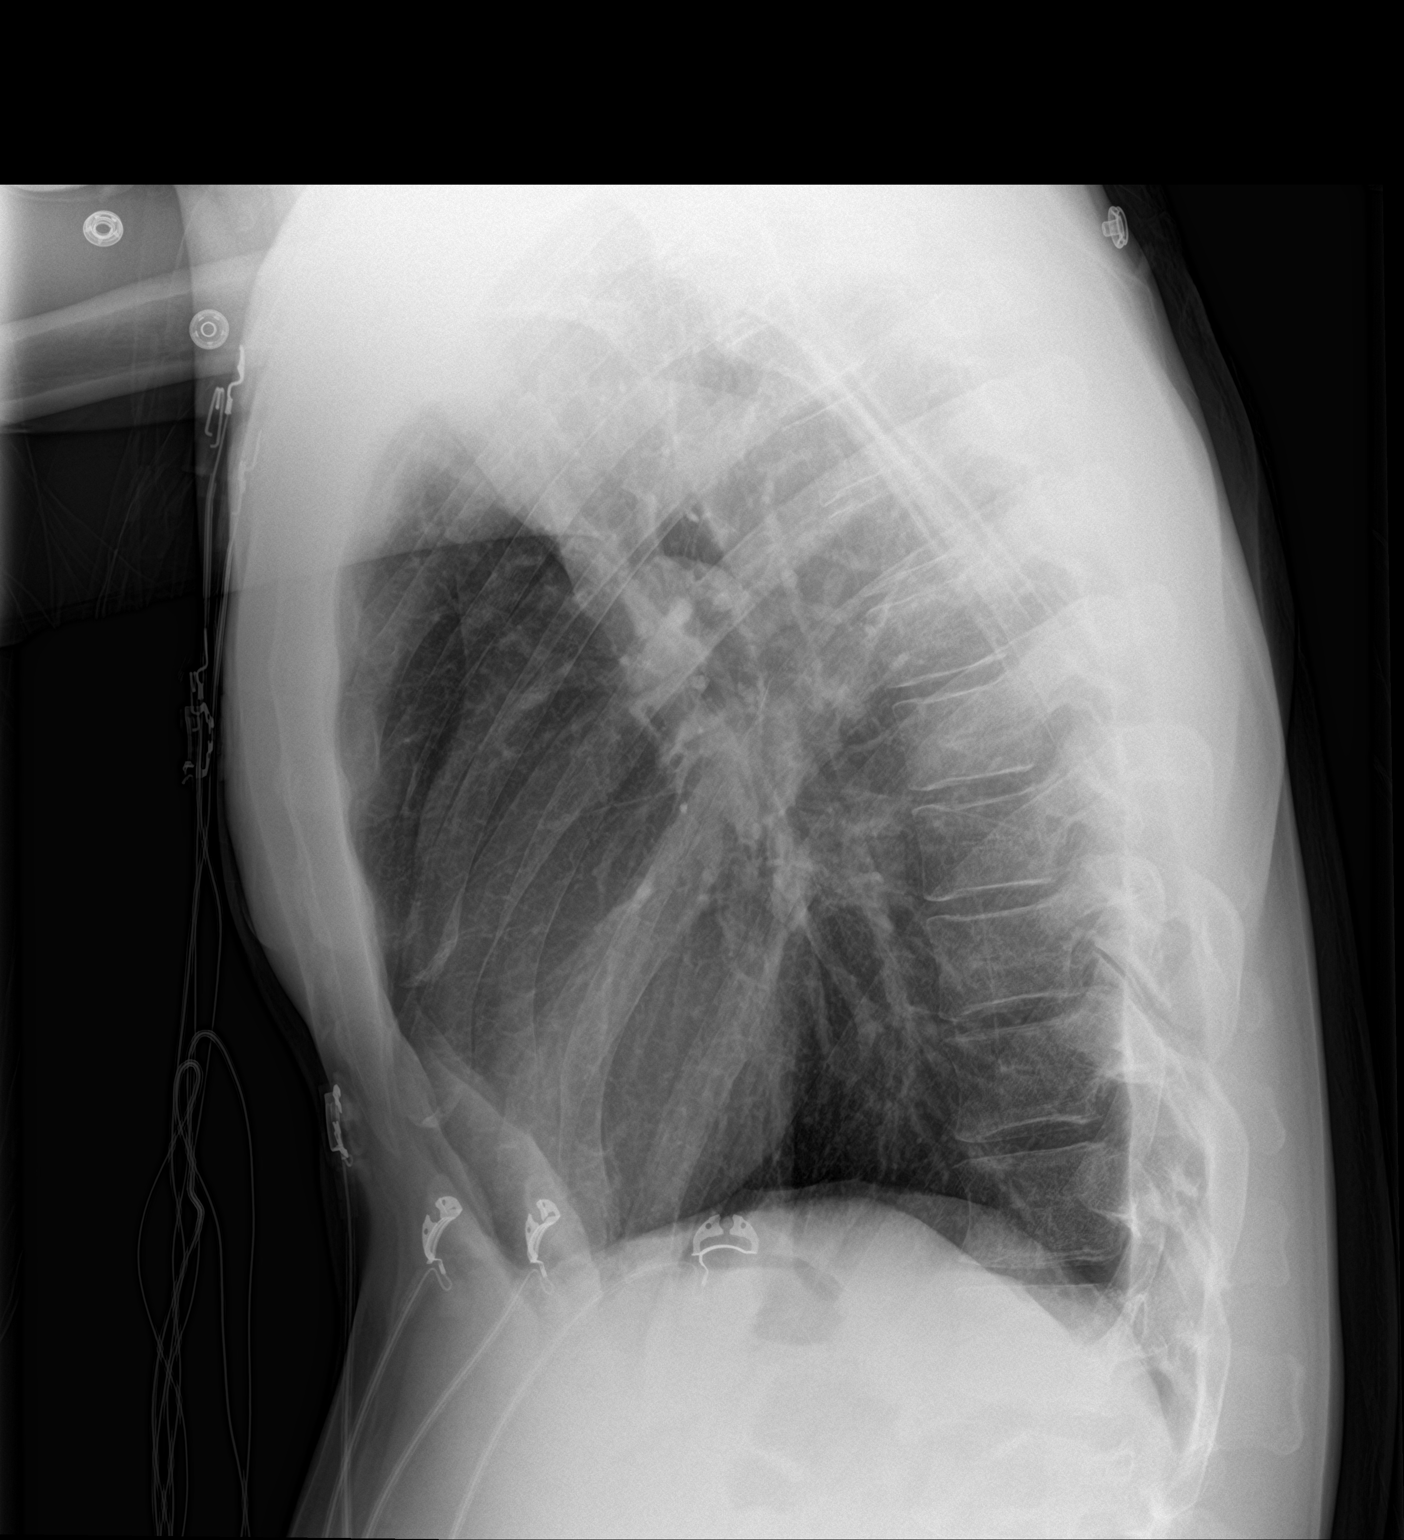

[2 of 2 positions shown; findings below may reference images not displayed]

FINDINGS: The lungs are clear. Heart size is normal. There is no pneumothorax
or pleural fluid. No acute or focal bony abnormality.
IMPRESSION: Negative chest.

## 2024-02-12 ENCOUNTER — Emergency Department (HOSPITAL_COMMUNITY)
Admission: EM | Admit: 2024-02-12 | Discharge: 2024-02-12 | Disposition: A | Discharge status: Home / Self Care | Payer: Self-pay | Attending: Emergency Medicine | Admitting: Emergency Medicine

## 2024-02-12 ENCOUNTER — Encounter (HOSPITAL_COMMUNITY): Payer: Self-pay

## 2024-02-12 ENCOUNTER — Other Ambulatory Visit: Payer: Self-pay

## 2024-02-12 DIAGNOSIS — B354 Tinea corporis: Secondary | ICD-10-CM | POA: Insufficient documentation

## 2024-02-12 DIAGNOSIS — R21 Rash and other nonspecific skin eruption: Secondary | ICD-10-CM

## 2024-02-12 LAB — RPR: RPR Ser Ql: NONREACTIVE

## 2024-02-12 LAB — RAPID HIV SCREEN (HIV 1/2 AB+AG)
HIV 1/2 Antibodies: NONREACTIVE
HIV-1 P24 Antigen - HIV24: NONREACTIVE

## 2024-02-12 MED ORDER — KETOCONAZOLE 2 % EX CREA: 1.0000 | TOPICAL_CREAM | Freq: Every day | CUTANEOUS | 2 refills | Status: AC

## 2024-02-12 MED ORDER — KETOCONAZOLE 2 % EX SHAM: 1.0000 | MEDICATED_SHAMPOO | CUTANEOUS | 0 refills | Status: AC

## 2024-02-12 NOTE — ED Notes (Signed)
 EDP at [facility]

## 2024-02-12 NOTE — ED Provider Notes (Signed)
 [place] EMERGENCY DEPARTMENT AT [facility] Provider Note   CSN: [id number] Arrival date & time: 02/12/24  9290     Patient presents with: Rash   Alfred Rogers is a 43 y.o. male.   Patient is a 43 year old male with no known medical problems who is presenting today with complaint of a rash.  He reports that initially started on his lower back and is continued to get worse.  Initially saying it was over the last 2 to 3 weeks but then admits that it has been closer to 8 weeks.  It is itchy and scaly but he denies any redness, pustules.  Feels that it was present after he was swimming in a pool but is not 100% sure.  It does not involve his palms or soles.  Mostly contained over his torso area.  Nobody else with similar symptoms in his home.  He has been trying hydrocortisone and different soaps on the area but it is only getting worse.  The history is provided by the patient.  Rash      Prior to Admission medications   Medication Sig Start Date End Date Taking? Authorizing Provider  ketoconazole  (NIZORAL ) 2 % cream Apply 1 Application topically daily. For 2 weeks 02/12/24  Yes Beckey Polkowski, Benton, MD  ketoconazole  (NIZORAL ) 2 % shampoo Apply 1 Application topically 2 (two) times a week. 02/14/24  Yes Doretha Benton, MD  albuterol  (VENTOLIN  HFA) 108 (90 Base) MCG/ACT inhaler Inhale 2 puffs into the lungs every 4 (four) hours as needed for wheezing or shortness of breath. 11/05/19   Cleotilde Rogue, MD  cetirizine  (ZYRTEC ) 10 MG tablet Take 1 tablet (10 mg total) by mouth daily. 04/30/15   Dettinger, Fonda LABOR, MD  fluticasone  (FLONASE ) 50 MCG/ACT nasal spray Place 1 spray into both nostrils 2 (two) times daily as needed for allergies or rhinitis. 04/30/15   Dettinger, Fonda LABOR, MD  nicotine  polacrilex (NICORETTE ) 2 MG gum Take 1 each (2 mg total) by mouth as needed for smoking cessation. 04/30/15   Dettinger, Fonda LABOR, MD    Allergies: Patient has no known allergies.    Review  of Systems  Skin:  Positive for rash.    Updated Vital Signs BP 116/84 (BP Location: Right Arm)   Pulse 97   Temp 98.5 F (36.9 C)   Resp 16   Ht 6' 3 (1.905 m)   Wt 81.6 kg   SpO2 98%   BMI 22.50 kg/m   Physical Exam Vitals and nursing note reviewed.  HENT:     Head: Normocephalic.  Cardiovascular:     Pulses: Normal pulses.  Skin:    Findings: Rash present.     Comments: Circular papules present most confluent over the back and abdomen but also with small areas on the legs and arms.  Seems to be some central clearing with scaliness on the outer circle.  No pustules, erythema or crusting.  No vesicles.  Neurological:     Mental Status: He is alert. Mental status is at baseline.     (all labs ordered are listed, but only abnormal results are displayed) Labs Reviewed  RAPID HIV SCREEN (HIV 1/2 AB+AG)  RPR    EKG: None  Radiology: No results found.   Procedures   Medications Ordered in the ED - No data to display  Medical Decision Making Amount and/or Complexity of Data Reviewed Labs: ordered.  Risk Prescription drug management.   Patient presenting today with a rash which could be tinea versus pityriasis rosea.  Low suspicion for syphilis.  No findings to suggest vesicular cause for the rash.  Will do ketoconazole  shampoo and cream.  Patient was also tested for HIV and syphilis.  Discussed if it was pityriasis that should self resolve but gave him dermatology's number if symptoms continue to worsen.  Does not look like an obvious guttate psoriasis or nummular eczema.  No hand or feet involvement.     Final diagnoses:  Rash  Tinea corporis    ED Discharge Orders          Ordered    ketoconazole  (NIZORAL ) 2 % shampoo  2 times weekly        02/12/24 0922    ketoconazole  (NIZORAL ) 2 % cream  Daily        02/12/24 9077               Doretha Folks, MD 02/12/24 [phone]

## 2024-02-12 NOTE — Discharge Instructions (Signed)
 The rash could be a fungus so I want you to try the shampoo and the cream.  If it is pityriasis it should go away within 6 to 8 weeks.  If none of these help and the blood work that comes back is normal which someone would call you if it was not then you would need to follow-up with dermatology.

## 2024-02-12 NOTE — ED Triage Notes (Signed)
 Pt c/o multiple areas of raised bumps all over body. Rash started a few weeks ago after going to a pool and has spread all over. Rash itches, denies pain or fevers.
